# Patient Record
Sex: Male | Born: 1975 | Race: White | Hispanic: No | Smoking: Never smoker
Health system: Southern US, Community
[De-identification: ages and names within clinical notes are randomized; demographics above are authoritative.]

## PROBLEM LIST (undated history)

## (undated) HISTORY — PX: VASECTOMY: SHX75

## (undated) HISTORY — PX: WISDOM TOOTH EXTRACTION: SHX21

---

## 2016-11-02 ENCOUNTER — Other Ambulatory Visit: Payer: Self-pay | Admitting: Unknown Physician Specialty

## 2016-11-02 ENCOUNTER — Ambulatory Visit
Admission: RE | Admit: 2016-11-02 | Discharge: 2016-11-02 | Disposition: A | Discharge status: Home / Self Care | Payer: Self-pay | Source: Ambulatory Visit | Attending: Unknown Physician Specialty | Admitting: Unknown Physician Specialty

## 2016-11-02 DIAGNOSIS — N433 Hydrocele, unspecified: Secondary | ICD-10-CM | POA: Insufficient documentation

## 2016-11-02 DIAGNOSIS — M545 Low back pain, unspecified: Secondary | ICD-10-CM

## 2016-11-02 DIAGNOSIS — N5089 Other specified disorders of the male genital organs: Secondary | ICD-10-CM

## 2016-11-02 DIAGNOSIS — N4400 Torsion of testis, unspecified: Secondary | ICD-10-CM

## 2016-11-12 NOTE — Progress Notes (Signed)
11/13/2016 11:28 AM   Michael Vang Feb 19, 1976 [id number]  Referring provider: Yetta Flock, MD [address] [place], [place] [address]  Chief Complaint  Patient presents with  . New Patient (Initial Visit)    HPI: Patient is a 41 year old Caucasian male who is referred to [place] by Dr. Silver Huguenin for a right hydrocele.  He states he was working on a car when he slipped and pulled his right groin.  The pain in the right groin continued to worsen and eventually the scrotum started to swell.  The pain was a 10/10.  He stated nothing helped the pain and nothing made it worse.  He sought further evaluation with urgent care.    Scrotal ultrasound on 11/02/2016 noted a large right hydrocele.  No testicular abnormality.  Right scrotal wall appears mildly thickened which could reflect cellulitis. Recommend clinical correlation.  I have independently reviewed the films.    He was given a week course of doxycycline and is taking NSAID'S for the discomfort.  He has seen moderate improvement with the pain and swelling.  He was considering cancelling the appointment, but he wanted to make sure things were not going to get worse or lead to more serious problems.    He has baseline symptoms of frequency and nocturia.  He denies any dysuria, gross hematuria and suprapubic pain.  He has not had fevers, chills, nausea or vomiting.    His UA today was unremarkable.     PMH: No past medical history on file.  Surgical History: Past Surgical History:  Procedure Laterality Date  . VASECTOMY    . WISDOM TOOTH EXTRACTION      Home Medications:  Allergies as of 11/13/2016      Reactions   Penicillins Hives      Medication List       Accurate as of 11/13/16 11:59 PM. Always use your most recent med list.          doxycycline 100 MG capsule Commonly known as:  VIBRAMYCIN Take 1 capsule (100 mg total) by mouth every 12 (twelve) hours.     naproxen sodium 220 MG tablet Commonly known as:  ANAPROX Take 220 mg by mouth 2 (two) times daily with a meal.       Allergies:  Allergies  Allergen Reactions  . Penicillins Hives    Family History: Family History  Problem Relation Age of Onset  . Prostate cancer Neg Hx   . Bladder Cancer Neg Hx   . Kidney cancer Neg Hx     Social History:  reports that he has never smoked. He has never used smokeless tobacco. He reports that he drinks alcohol. He reports that he does not use drugs.  ROS: UROLOGY Frequent Urination?: Yes Hard to postpone urination?: No Burning/pain with urination?: No Get up at night to urinate?: Yes Leakage of urine?: No Urine stream starts and stops?: No Trouble starting stream?: No Do you have to strain to urinate?: No Blood in urine?: No Urinary tract infection?: No Sexually transmitted disease?: No Injury to kidneys or bladder?: No Painful intercourse?: No Weak stream?: No Erection problems?: No Penile pain?: No  Gastrointestinal Nausea?: No Vomiting?: No Indigestion/heartburn?: No Diarrhea?: No Constipation?: No  Constitutional Fever: No Night sweats?: No Weight loss?: No Fatigue?: No  Skin Skin rash/lesions?: No Itching?: No  Eyes Blurred vision?: No Double vision?: No  Ears/Nose/Throat Sore throat?: No Sinus problems?: No  Hematologic/Lymphatic Swollen glands?: No  Easy bruising?: No  Cardiovascular Leg swelling?: No Chest pain?: No  Respiratory Cough?: No Shortness of breath?: No  Endocrine Excessive thirst?: No  Musculoskeletal Back pain?: No Joint pain?: No  Neurological Headaches?: No Dizziness?: No  Psychologic Depression?: No Anxiety?: No  Physical Exam: BP (!) 164/94 (BP Location: Left Arm, Patient Position: Sitting, Cuff Size: Large)   Pulse 69   Ht 5\' 11"  (1.803 m)   Wt 276 lb 9.6 oz (125.5 kg)   BMI 38.58 kg/m   Constitutional: Well nourished. Alert and oriented, No acute  distress. HEENT: [place] AT, moist mucus membranes. Trachea midline, no masses. Cardiovascular: No clubbing, cyanosis, or edema. Respiratory: Normal respiratory effort, no increased work of breathing. GI: Abdomen is soft, non tender, non distended, no abdominal masses. Liver and spleen not palpable.  No hernias appreciated.  Stool sample for occult testing is not indicated.   GU: No CVA tenderness.  No bladder fullness or masses.  Patient with buried phallus.  Urethral meatus is patent.  No penile discharge. No penile lesions or rashes. Scrotum without lesions, cysts, rashes and/or edema.  Testicles are located scrotally bilaterally. No masses are appreciated in the testicles. Left and right epididymis are normal.  Moderate sized hydrocele on the right.  Rectal: Deferred Skin: No rashes, bruises or suspicious lesions. Lymph: No cervical or inguinal adenopathy. Neurologic: Grossly intact, no focal deficits, moving all 4 extremities. Psychiatric: Normal mood and affect.  Laboratory Data:  Urinalysis Unremarkable.  See EPIC.    Pertinent Imaging: CLINICAL DATA:  Swollen testicle on the right for 1 month.  EXAM: SCROTAL ULTRASOUND  DOPPLER ULTRASOUND OF THE TESTICLES  TECHNIQUE: Complete ultrasound examination of the testicles, epididymis, and other scrotal structures was performed. Color and spectral Doppler ultrasound were also utilized to evaluate blood flow to the testicles.  COMPARISON:  None.  FINDINGS: Right testicle  Measurements: 5.6 x 2.5 x 2.4 cm. No mass or microlithiasis visualized.  Left testicle  Measurements: 4.8 x 1.9 x 2.4 cm. No mass or microlithiasis visualized.  Right epididymis:  Normal in size and appearance.  Left epididymis:  Normal in size and appearance.  Hydrocele:  Large right hydrocele  Varicocele:  None visualized.  Pulsed Doppler interrogation of both testes demonstrates normal low resistance arterial and venous waveforms  bilaterally.  Right scrotal wall appears mildly thickened, 9 mm, question cellulitis.  IMPRESSION: Large right hydrocele.  No testicular abnormality.  Right scrotal wall appears mildly thickened which could reflect cellulitis. Recommend clinical correlation.   Electronically Signed   By: Charlett NoseKevin  Dover M.D.   On: 11/02/2016 11:50  Assessment & Plan:    1. Right hydrocele   - explained to the patient that a hydrocele is a collection of peritoneal fluid between the parietal and visceral layers of the tunica vaginalis, which directly surrounds the testis and spermatic cord.  It is the same layer that forms the peritoneal lining of the abdomen. Hydroceles are believed to arise from an imbalance of secretion and reabsorption of fluid from the tunica vaginalis  - This may have been a reactive hydrocele as patient has seen improvement with antibiotics  - explained to the patient that he could continue to manage the hydrocele conservatively, percutaneous drainage of the hydrocele and hydrocelectomy  - explained that conservative management would include NSAIDS, limiting strenuous activities and scrotal support  - explained that simple aspiration is generally unsuccessful because of the rapid re accumulation of fluid but may be effective if combined with instillation of a  sclerosing agent (eg, tetracycline, alcohol) into the sac. The potential risks of this approach are a low incidence of reactive orchitis/epididymitis and a higher rate of recurrence, which may then make open surgery more difficult because of the development of adhesions between the hydrocele sac and the scrotal contents.  - explained that hydrocelectomy consists of draining the fluid and removing the sac; postoperative complications including hematoma, swelling, pain, infection, damage to surrounding structures were all discussed in detail. We also discussed the recurrence rate following hydrocelectomy around 10%. Discussed the  postoperative care including scrotal support, NSAIDs, and avoidance of strenuous physical activity for several weeks.  -I also explained the risks of general anesthesia, such as: MI, CVA, paralysis, coma and/or death.  - patient would like to continue with conservative management and a longer course of antibiotics  -UA  - urine culture  - BMP  - CBC  - RTC in 2 weeks for exam  - Advised to contact our office or seek treatment in the ED if becomes febrile or pain/ vomiting are difficult control in order to arrange for emergent/urgent intervention  Return in about 2 weeks (around 11/27/2016) for exam.  These notes generated with voice recognition software. I apologize for typographical errors.  Michiel Cowboy, PA-C  [facility] [address], [address] [place], [place] [address] [phone]

## 2016-11-13 ENCOUNTER — Encounter: Payer: Self-pay | Admitting: Urology

## 2016-11-13 ENCOUNTER — Ambulatory Visit (INDEPENDENT_AMBULATORY_CARE_PROVIDER_SITE_OTHER): Payer: Self-pay | Admitting: Urology

## 2016-11-13 VITALS — BP 164/94 | HR 69 | Ht 71.0 in | Wt 276.6 lb

## 2016-11-13 DIAGNOSIS — N432 Other hydrocele: Secondary | ICD-10-CM

## 2016-11-13 LAB — URINALYSIS, COMPLETE
Bilirubin, UA: NEGATIVE
GLUCOSE, UA: NEGATIVE
KETONES UA: NEGATIVE
LEUKOCYTES UA: NEGATIVE
Nitrite, UA: NEGATIVE
PROTEIN UA: NEGATIVE
RBC, UA: NEGATIVE
Specific Gravity, UA: 1.025 (ref 1.005–1.030)
Urobilinogen, Ur: 0.2 mg/dL (ref 0.2–1.0)
pH, UA: 5.5 (ref 5.0–7.5)

## 2016-11-13 MED ORDER — DOXYCYCLINE HYCLATE 100 MG PO CAPS
100.0000 mg | ORAL_CAPSULE | Freq: Two times a day (BID) | ORAL | 0 refills | Status: AC
Start: 2016-11-13 — End: ?

## 2016-11-14 LAB — BASIC METABOLIC PANEL
BUN/Creatinine Ratio: 16 (ref 9–20)
BUN: 14 mg/dL (ref 6–24)
CHLORIDE: 101 mmol/L (ref 96–106)
CO2: 25 mmol/L (ref 18–29)
CREATININE: 0.87 mg/dL (ref 0.76–1.27)
Calcium: 9.8 mg/dL (ref 8.7–10.2)
GFR calc Af Amer: 124 mL/min/{1.73_m2} (ref >59)
GFR calc non Af Amer: 107 mL/min/{1.73_m2} (ref >59)
GLUCOSE: 94 mg/dL (ref 65–99)
POTASSIUM: 4.6 mmol/L (ref 3.5–5.2)
SODIUM: 142 mmol/L (ref 134–144)

## 2016-11-14 LAB — CBC WITH DIFFERENTIAL/PLATELET
BASOS ABS: 0.1 10*3/uL (ref 0.0–0.2)
Basos: 1 %
EOS (ABSOLUTE): 0.1 10*3/uL (ref 0.0–0.4)
EOS: 2 %
Hematocrit: 45.1 % (ref 37.5–51.0)
Hemoglobin: 15.5 g/dL (ref 13.0–17.7)
IMMATURE GRANULOCYTES: 1 %
Immature Grans (Abs): 0 10*3/uL (ref 0.0–0.1)
Lymphocytes Absolute: 2.7 10*3/uL (ref 0.7–3.1)
Lymphs: 40 %
MCH: 29.9 pg (ref 26.6–33.0)
MCHC: 34.4 g/dL (ref 31.5–35.7)
MCV: 87 fL (ref 79–97)
MONOS ABS: 0.6 10*3/uL (ref 0.1–0.9)
Monocytes: 9 %
NEUTROS PCT: 47 %
Neutrophils Absolute: 3.2 10*3/uL (ref 1.4–7.0)
PLATELETS: 214 10*3/uL (ref 150–379)
RBC: 5.19 x10E6/uL (ref 4.14–5.80)
RDW: 13.8 % (ref 12.3–15.4)
WBC: 6.7 10*3/uL (ref 3.4–10.8)

## 2016-11-16 LAB — CULTURE, URINE COMPREHENSIVE

## 2016-11-21 ENCOUNTER — Ambulatory Visit: Payer: Self-pay

## 2016-11-28 ENCOUNTER — Ambulatory Visit: Payer: Self-pay | Admitting: Urology

## 2018-02-10 IMAGING — US US SCROTUM
1 series · 14 of 25 positions shown · non-contrast
Comparison: None.

CLINICAL DATA: Swollen testicle on the right for 1 month.

EXAM:
SCROTAL ULTRASOUND
DOPPLER ULTRASOUND OF THE TESTICLES
TECHNIQUE: Complete ultrasound examination of the testicles, epididymis, and
other scrotal structures was performed. Color and spectral Doppler
ultrasound were also utilized to evaluate blood flow to the
testicles.

[Series 1: us scrotum · 0.11mm/px · 14 of 95 slices shown]
[im 1/95]
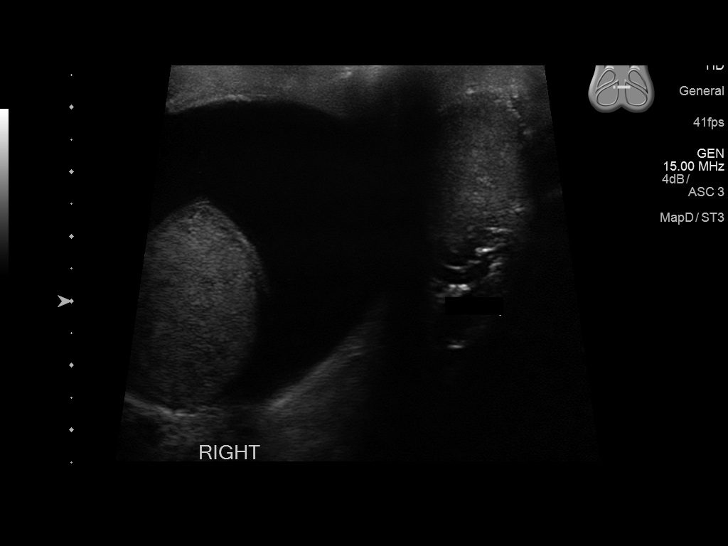
[im 8/95]
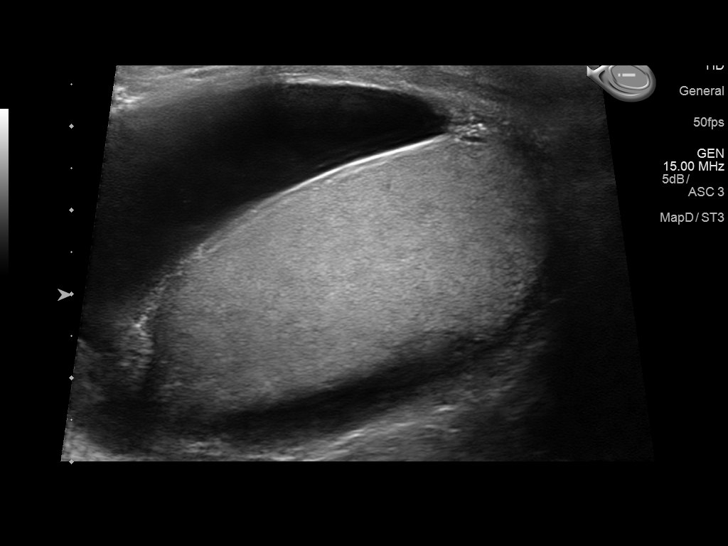
[im 16/95]
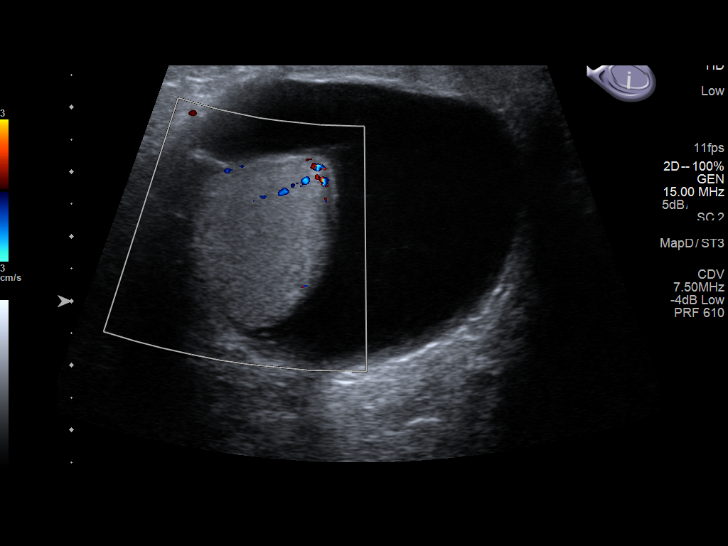
[im 24/95]
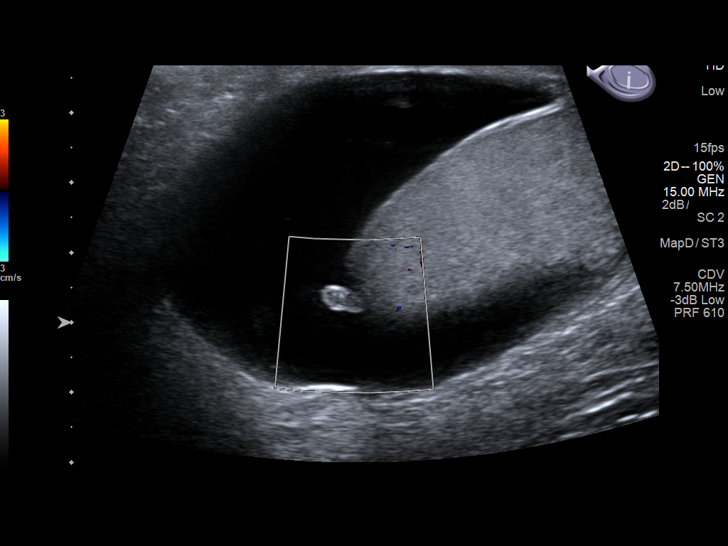
[im 32/95]
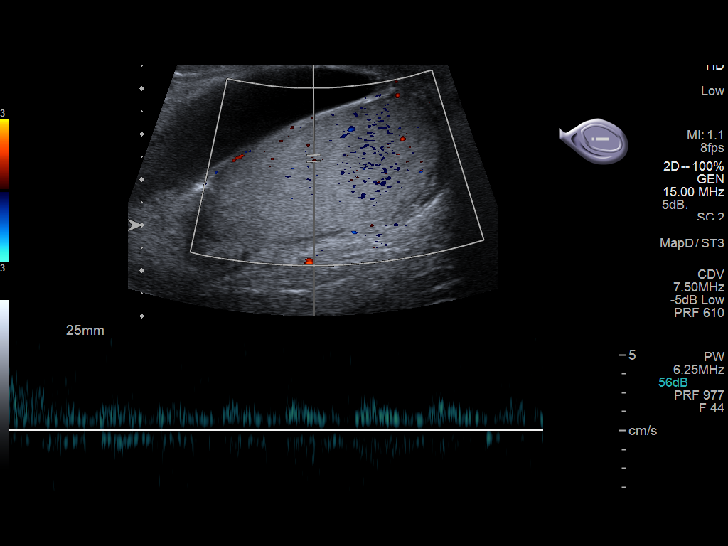
[im 36/95]
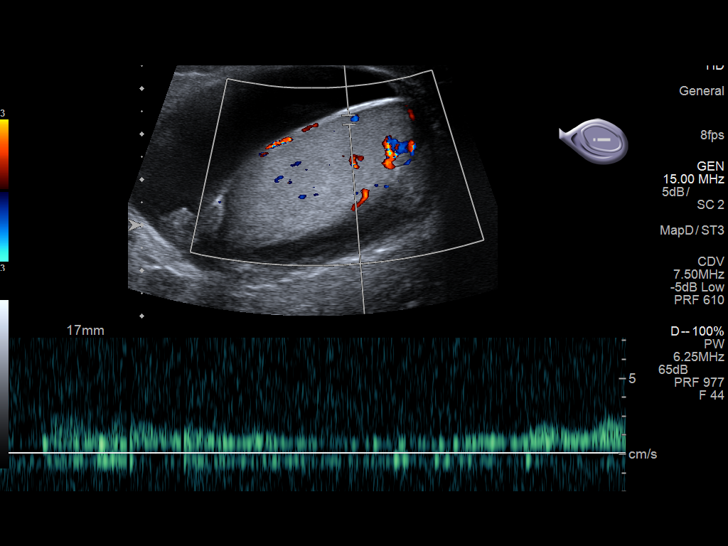
[im 44/95]
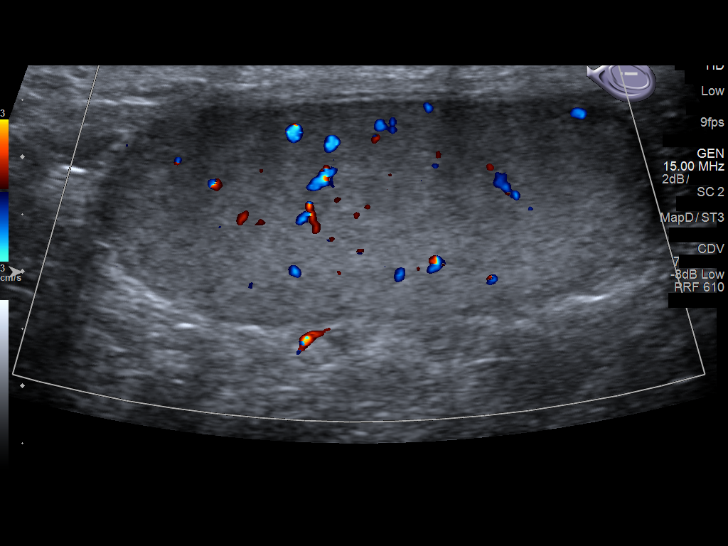
[im 51/95]
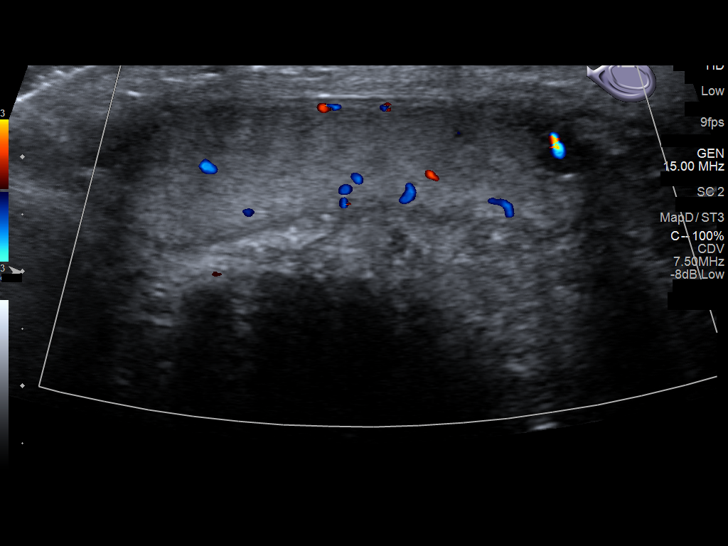
[im 59/95]
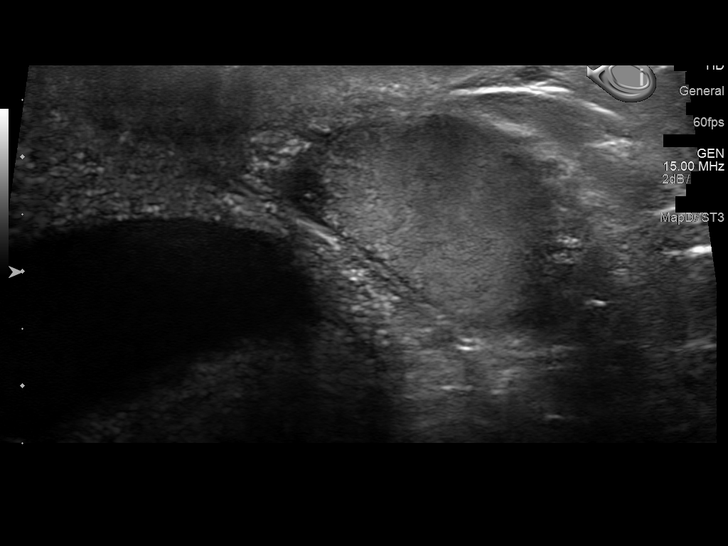
[im 63/95]
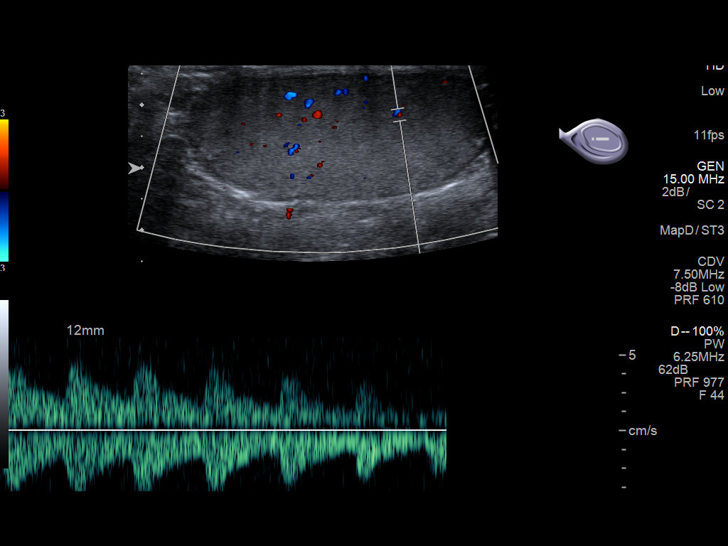
[im 71/95]
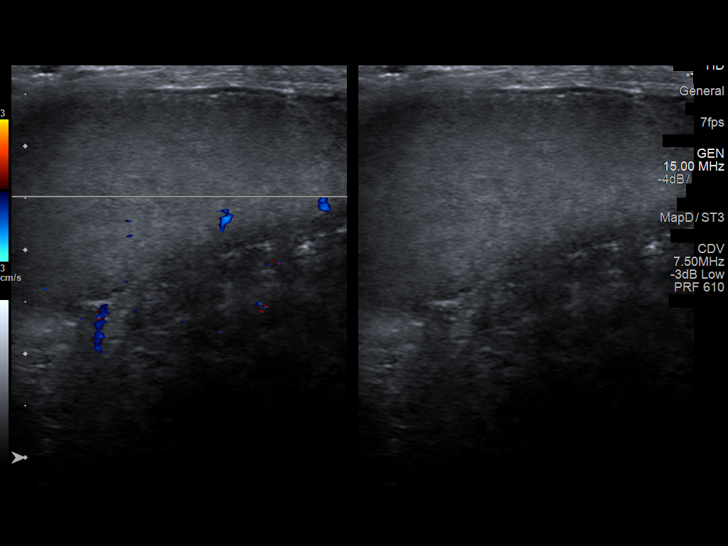
[im 79/95]
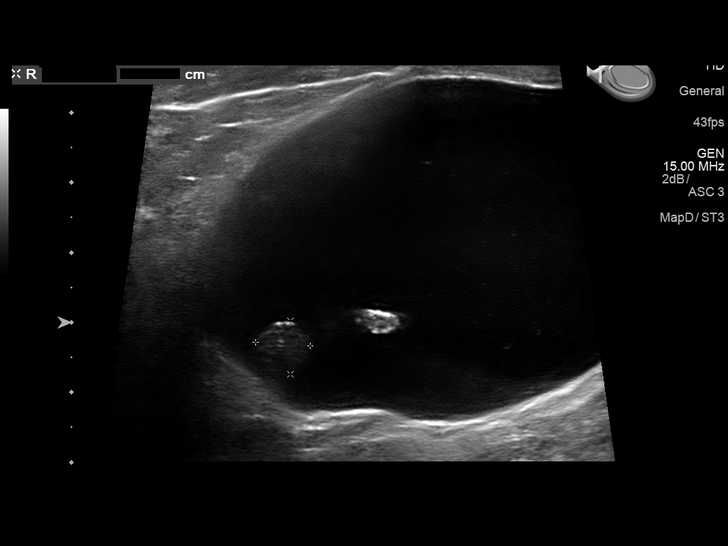
[im 87/95]
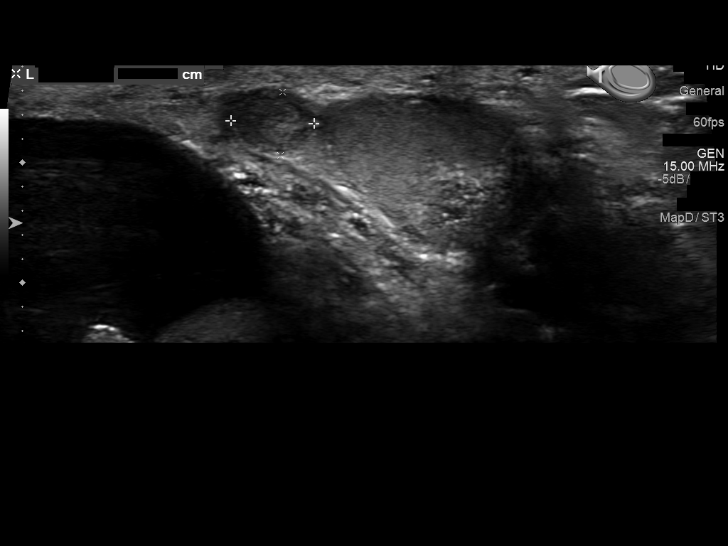
[im 95/95]
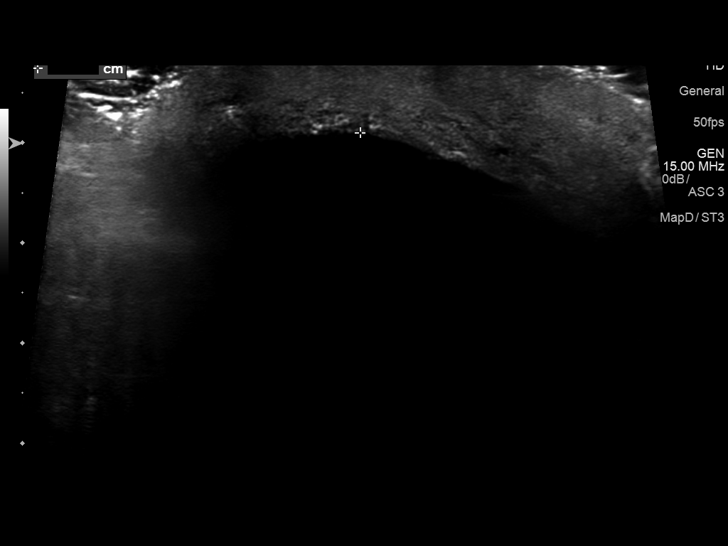

[14 of 25 positions shown; findings below may reference images not displayed]

FINDINGS: Right testicle

Measurements: 5.6 x 2.5 x 2.4 cm. No mass or microlithiasis
visualized.

Left testicle

Measurements: 4.8 x 1.9 x 2.4 cm. No mass or microlithiasis
visualized.

Right epididymis:  Normal in size and appearance.

Left epididymis:  Normal in size and appearance.

Hydrocele:  Large right hydrocele

Varicocele:  None visualized.

Pulsed Doppler interrogation of both testes demonstrates normal low
resistance arterial and venous waveforms bilaterally.

Right scrotal wall appears mildly thickened, 9 mm, question
cellulitis.
IMPRESSION: Large right hydrocele.  No testicular abnormality.

Right scrotal wall appears mildly thickened which could reflect
cellulitis. Recommend clinical correlation.
